# Patient Record
Sex: Female | Born: 1960 | Race: Black or African American | Hispanic: No | Marital: Married | Smoking: Never smoker
Health system: Southern US, Community
[De-identification: ages and names within clinical notes are randomized; demographics above are authoritative.]

## PROBLEM LIST (undated history)

## (undated) ENCOUNTER — Emergency Department (HOSPITAL_COMMUNITY): Payer: Self-pay

## (undated) DIAGNOSIS — T783XXA Angioneurotic edema, initial encounter: Secondary | ICD-10-CM

## (undated) DIAGNOSIS — L509 Urticaria, unspecified: Secondary | ICD-10-CM

## (undated) HISTORY — DX: Urticaria, unspecified: L50.9

## (undated) HISTORY — PX: ORIF ANKLE FRACTURE BIMALLEOLAR: SUR920

## (undated) HISTORY — DX: Angioneurotic edema, initial encounter: T78.3XXA

---

## 1998-08-05 ENCOUNTER — Ambulatory Visit (HOSPITAL_COMMUNITY): Admission: RE | Admit: 1998-08-05 | Discharge: 1998-08-05 | Payer: Self-pay | Admitting: Obstetrics & Gynecology

## 2000-09-17 ENCOUNTER — Other Ambulatory Visit: Admission: RE | Admit: 2000-09-17 | Discharge: 2000-09-17 | Payer: Self-pay | Admitting: Obstetrics & Gynecology

## 2010-08-10 ENCOUNTER — Emergency Department (HOSPITAL_COMMUNITY): Admission: EM | Admit: 2010-08-10 | Discharge: 2010-08-10 | Payer: Self-pay | Admitting: Emergency Medicine

## 2010-08-22 ENCOUNTER — Ambulatory Visit (HOSPITAL_COMMUNITY): Admission: RE | Admit: 2010-08-22 | Discharge: 2010-08-23 | Payer: Self-pay | Admitting: Orthopaedic Surgery

## 2010-12-26 LAB — URINALYSIS, ROUTINE W REFLEX MICROSCOPIC
Bilirubin Urine: NEGATIVE
Glucose, UA: NEGATIVE mg/dL
Glucose, UA: NEGATIVE mg/dL
Hgb urine dipstick: NEGATIVE
Hgb urine dipstick: NEGATIVE
Protein, ur: NEGATIVE mg/dL
Specific Gravity, Urine: 1.026 (ref 1.005–1.030)
Specific Gravity, Urine: 1.026 (ref 1.005–1.030)
Urobilinogen, UA: 4 mg/dL — ABNORMAL HIGH (ref 0.0–1.0)

## 2010-12-26 LAB — URINE CULTURE
Colony Count: NO GROWTH
Culture  Setup Time: 201111082129
Culture: NO GROWTH

## 2010-12-26 LAB — COMPREHENSIVE METABOLIC PANEL
Alkaline Phosphatase: 89 U/L (ref 39–117)
BUN: 10 mg/dL (ref 6–23)
CO2: 28 mEq/L (ref 19–32)
Chloride: 111 mEq/L (ref 96–112)
Creatinine, Ser: 0.84 mg/dL (ref 0.4–1.2)
GFR calc non Af Amer: >60 mL/min (ref >60)
Glucose, Bld: 89 mg/dL (ref 70–99)
Potassium: 4.2 mEq/L (ref 3.5–5.1)
Total Bilirubin: 0.8 mg/dL (ref 0.3–1.2)

## 2010-12-26 LAB — DIFFERENTIAL
Basophils Absolute: 0.1 10*3/uL (ref 0.0–0.1)
Basophils Relative: 1 % (ref 0–1)
Lymphocytes Relative: 23 % (ref 12–46)
Monocytes Absolute: 0.5 10*3/uL (ref 0.1–1.0)
Neutro Abs: 3.5 10*3/uL (ref 1.7–7.7)

## 2010-12-26 LAB — CBC
HCT: 34 % — ABNORMAL LOW (ref 36.0–46.0)
Hemoglobin: 10.8 g/dL — ABNORMAL LOW (ref 12.0–15.0)
MCH: 25 pg — ABNORMAL LOW (ref 26.0–34.0)
MCV: 78.7 fL (ref 78.0–100.0)
RBC: 4.32 MIL/uL (ref 3.87–5.11)
WBC: 5.3 10*3/uL (ref 4.0–10.5)

## 2010-12-26 LAB — SURGICAL PCR SCREEN: Staphylococcus aureus: NEGATIVE

## 2010-12-26 LAB — URINE MICROSCOPIC-ADD ON

## 2010-12-26 LAB — PROTIME-INR: Prothrombin Time: 13.4 seconds (ref 11.6–15.2)

## 2010-12-26 LAB — APTT: aPTT: 27 seconds (ref 24–37)

## 2012-01-03 ENCOUNTER — Emergency Department (HOSPITAL_COMMUNITY)
Admission: EM | Admit: 2012-01-03 | Discharge: 2012-01-03 | Disposition: A | Discharge status: Home / Self Care | Payer: BC Managed Care – PPO | Attending: Emergency Medicine | Admitting: Emergency Medicine

## 2012-01-03 ENCOUNTER — Emergency Department (HOSPITAL_COMMUNITY): Payer: BC Managed Care – PPO

## 2012-01-03 ENCOUNTER — Encounter (HOSPITAL_COMMUNITY): Payer: Self-pay | Admitting: Adult Health

## 2012-01-03 DIAGNOSIS — N898 Other specified noninflammatory disorders of vagina: Secondary | ICD-10-CM | POA: Insufficient documentation

## 2012-01-03 DIAGNOSIS — N939 Abnormal uterine and vaginal bleeding, unspecified: Secondary | ICD-10-CM

## 2012-01-03 LAB — CBC
MCH: 24.9 pg — ABNORMAL LOW (ref 26.0–34.0)
MCHC: 32.1 g/dL (ref 30.0–36.0)
MCV: 77.7 fL — ABNORMAL LOW (ref 78.0–100.0)
Platelets: 294 10*3/uL (ref 150–400)
RBC: 4.49 MIL/uL (ref 3.87–5.11)

## 2012-01-03 LAB — URINE MICROSCOPIC-ADD ON

## 2012-01-03 LAB — COMPREHENSIVE METABOLIC PANEL
ALT: 7 U/L (ref 0–35)
BUN: 8 mg/dL (ref 6–23)
Calcium: 8.9 mg/dL (ref 8.4–10.5)
GFR calc Af Amer: >90 mL/min (ref >90)
Glucose, Bld: 104 mg/dL — ABNORMAL HIGH (ref 70–99)
Sodium: 136 mEq/L (ref 135–145)
Total Protein: 7.8 g/dL (ref 6.0–8.3)

## 2012-01-03 LAB — DIFFERENTIAL
Basophils Relative: 1 % (ref 0–1)
Eosinophils Absolute: 0 10*3/uL (ref 0.0–0.7)
Eosinophils Relative: 0 % (ref 0–5)
Lymphs Abs: 1.1 10*3/uL (ref 0.7–4.0)

## 2012-01-03 LAB — URINALYSIS, ROUTINE W REFLEX MICROSCOPIC
Bilirubin Urine: NEGATIVE
Ketones, ur: NEGATIVE mg/dL
Protein, ur: >300 mg/dL — AB
Specific Gravity, Urine: 1.025 (ref 1.005–1.030)
Urobilinogen, UA: 0.2 mg/dL (ref 0.0–1.0)

## 2012-01-03 MED ORDER — MEDROXYPROGESTERONE ACETATE 5 MG PO TABS: 10.0000 mg | ORAL_TABLET | Freq: Every day | ORAL | Status: DC

## 2012-01-03 NOTE — ED Notes (Signed)
Pt c/o vaginal bleeding that began yesterday and is associated with clots and heavy bleeding staes she is going through one pad an hour. C/o heart palpitations.

## 2012-01-03 NOTE — ED Provider Notes (Signed)
History     CSN: [id number]  Arrival date & time 01/03/12  4098   First MD Initiated Contact with Patient 01/03/12 0444      Chief Complaint  Patient presents with  . Vaginal Bleeding    (Consider location/radiation/quality/duration/timing/severity/associated sxs/prior treatment) HPI Comments: Started with what she believed was her normal menstrual period yesterday but has been bleeding much more than normal.  She felt light-headed at one point and decided to come in to be checked.  She denies pain.  Patient is a 51 y.o. female presenting with vaginal bleeding. The history is provided by the patient.  Vaginal Bleeding This is a new problem. The current episode started yesterday. The problem occurs constantly. The problem has been gradually worsening. Pertinent negatives include no abdominal pain. The symptoms are aggravated by nothing. The symptoms are relieved by nothing. She has tried nothing for the symptoms.    History reviewed. No pertinent past medical history.  History reviewed. No pertinent past surgical history.  History reviewed. No pertinent family history.  History  Substance Use Topics  . Smoking status: Never Smoker   . Smokeless tobacco: Not on file  . Alcohol Use: No    OB History    Grav Para Term Preterm Abortions TAB SAB Ect Mult Living                  Review of Systems  Gastrointestinal: Negative for abdominal pain.  Genitourinary: Positive for vaginal bleeding.  All other systems reviewed and are negative.    Allergies  Codeine  Home Medications  No current outpatient prescriptions on file.  BP 142/92  Pulse 81  Temp(Src) 98.1 F (36.7 C) (Oral)  Resp 18  SpO2 100%  Physical Exam  Nursing note and vitals reviewed. Constitutional: She is oriented to person, place, and time. She appears well-developed and well-nourished. No distress.  HENT:  Head: Normocephalic and atraumatic.  Neck: Normal range of motion. Neck supple.    Cardiovascular: Normal rate and regular rhythm.   No murmur heard. Pulmonary/Chest: Effort normal and breath sounds normal. No respiratory distress.  Abdominal: Soft. She exhibits no distension. There is no tenderness.  Musculoskeletal: Normal range of motion. She exhibits no edema.  Neurological: She is alert and oriented to person, place, and time.  Skin: Skin is warm and dry. She is not diaphoretic.    ED Course  Procedures (including critical care time)  Labs Reviewed  URINALYSIS, ROUTINE W REFLEX MICROSCOPIC - Abnormal; Notable for the following:    Color, Urine RED (*) BIOCHEMICALS MAY BE AFFECTED BY COLOR   APPearance TURBID (*)    Hgb urine dipstick LARGE (*)    Protein, ur >300 (*)    Leukocytes, UA SMALL (*)    All other components within normal limits  CBC - Abnormal; Notable for the following:    Hemoglobin 11.2 (*)    HCT 34.9 (*)    MCV 77.7 (*)    MCH 24.9 (*)    All other components within normal limits  COMPREHENSIVE METABOLIC PANEL - Abnormal; Notable for the following:    Potassium 3.4 (*)    Glucose, Bld 104 (*)    All other components within normal limits  URINE MICROSCOPIC-ADD ON - Abnormal; Notable for the following:    Bacteria, UA FIELD OBSCURED BY RBC'S (*)    All other components within normal limits  DIFFERENTIAL   No results found.   No diagnosis found.    MDM  The hemoglobin  is stable and ultrasound shows adenomyosis.  I will prescribe provera and have patient see gyn in the near future.  To return prn.        Geoffery Lyons, MD 01/03/12 [phone]

## 2012-01-03 NOTE — ED Notes (Signed)
Patient c/o progressive abnormal, heavy menstrual cycle with blood clots present x 2 days accompanied with intermittent lower abdominal cramping, 5/10.

## 2012-01-03 NOTE — Discharge Instructions (Signed)

## 2012-01-03 NOTE — ED Notes (Signed)
Pelvic setup at bedside.

## 2012-06-15 ENCOUNTER — Emergency Department (HOSPITAL_COMMUNITY)
Admission: EM | Admit: 2012-06-15 | Discharge: 2012-06-15 | Disposition: A | Discharge status: Home / Self Care | Payer: No Typology Code available for payment source | Attending: Emergency Medicine | Admitting: Emergency Medicine

## 2012-06-15 ENCOUNTER — Encounter (HOSPITAL_COMMUNITY): Payer: Self-pay | Admitting: Emergency Medicine

## 2012-06-15 DIAGNOSIS — Z043 Encounter for examination and observation following other accident: Secondary | ICD-10-CM | POA: Insufficient documentation

## 2012-06-15 DIAGNOSIS — S46819A Strain of other muscles, fascia and tendons at shoulder and upper arm level, unspecified arm, initial encounter: Secondary | ICD-10-CM

## 2012-06-15 DIAGNOSIS — M542 Cervicalgia: Secondary | ICD-10-CM | POA: Insufficient documentation

## 2012-06-15 MED ORDER — CYCLOBENZAPRINE HCL 10 MG PO TABS: 10.0000 mg | ORAL_TABLET | Freq: Two times a day (BID) | ORAL | Status: AC | PRN

## 2012-06-15 MED ORDER — IBUPROFEN 800 MG PO TABS
800.0000 mg | ORAL_TABLET | Freq: Once | ORAL | Status: AC
  Administered 2012-06-15: 800 mg via ORAL
  Filled 2012-06-15: qty 1
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
## 2012-06-15 NOTE — ED Provider Notes (Signed)
Medical screening examination/treatment/procedure(s) were conducted as a shared visit with non-physician practitioner(s) and myself.  I personally evaluated the patient during the encounter  Cindy Owens T Azucena Dart, MD 06/15/12 2338 
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
## 2013-01-23 ENCOUNTER — Emergency Department (INDEPENDENT_AMBULATORY_CARE_PROVIDER_SITE_OTHER)
Admission: EM | Admit: 2013-01-23 | Discharge: 2013-01-23 | Disposition: A | Discharge status: Home / Self Care | Payer: BC Managed Care – PPO | Source: Home / Self Care | Attending: Emergency Medicine | Admitting: Emergency Medicine

## 2013-01-23 ENCOUNTER — Encounter (HOSPITAL_COMMUNITY): Payer: Self-pay | Admitting: Emergency Medicine

## 2013-01-23 DIAGNOSIS — H6120 Impacted cerumen, unspecified ear: Secondary | ICD-10-CM

## 2013-01-23 DIAGNOSIS — J029 Acute pharyngitis, unspecified: Secondary | ICD-10-CM

## 2013-01-23 DIAGNOSIS — B351 Tinea unguium: Secondary | ICD-10-CM

## 2013-01-23 DIAGNOSIS — H6123 Impacted cerumen, bilateral: Secondary | ICD-10-CM

## 2013-01-23 MED ORDER — FEXOFENADINE HCL 60 MG PO TABS: 60.0000 mg | ORAL_TABLET | Freq: Two times a day (BID) | ORAL | Status: DC

## 2013-01-23 MED ORDER — IBUPROFEN 400 MG PO TABS: 400.0000 mg | ORAL_TABLET | Freq: Four times a day (QID) | ORAL | Status: DC | PRN

## 2013-01-23 NOTE — ED Notes (Signed)
Pt c/o sore throat onset 2 days Reports it started w/bilateral clogged ears Sx now include: dysphagia, nasal congestion Denies: f/v/n/d Taking OTC cold meds w/temp relief  Also c/o poss fungus that has discolored her toe nails on both feet Occasional itching is associated   She is alert and oriented w/no signs of acute distress.

## 2013-01-23 NOTE — ED Provider Notes (Signed)
History     CSN: [id number]  Arrival date & time 01/23/13  1013   First MD Initiated Contact with Patient 01/23/13 1022      Chief Complaint  Patient presents with  . Sore Throat    (Consider location/radiation/quality/duration/timing/severity/associated sxs/prior treatment) HPI Comments: Patient presents urgent care describing a sore throat for 2 days with mild congestion runny nose occasional sneezing. He does hurt when she swallows. Patient also describes that she has had a fungal infection of her toenails on both feet for several months. Is inquiring also to establish with a primary care Dr. and we had any ideas. Patient denies any fevers, neck pain, headache, bodyaches cough or shortness of breath associated with her sore throat. He is taking some over-the-counter medicines with partial relief  Patient is a 52 y.o. female presenting with pharyngitis. The history is provided by the patient.  Sore Throat This is a new problem. The current episode started 2 days ago. The problem occurs constantly. The problem has not changed since onset.Pertinent negatives include no chest pain, no abdominal pain, no headaches and no shortness of breath. The symptoms are aggravated by swallowing. Nothing relieves the symptoms. The treatment provided no relief.    History reviewed. No pertinent past medical history.  Past Surgical History  Procedure Laterality Date  . Orif ankle fracture bimalleolar      No family history on file.  History  Substance Use Topics  . Smoking status: Never Smoker   . Smokeless tobacco: Never Used  . Alcohol Use: No    OB History   Grav Para Term Preterm Abortions TAB SAB Ect Mult Living                  Review of Systems  Constitutional: Negative for fever, chills, activity change, appetite change and fatigue.  HENT: Positive for ear pain, congestion, sore throat and rhinorrhea. Negative for facial swelling, trouble swallowing, neck pain, neck stiffness,  dental problem and ear discharge.   Eyes: Negative for pain.  Respiratory: Negative for shortness of breath.   Cardiovascular: Negative for chest pain.  Gastrointestinal: Negative for abdominal pain.  Endocrine: Negative for polyuria.  Skin: Negative for color change and rash.  Neurological: Negative for weakness and headaches.  Hematological: Negative for adenopathy.    Allergies  Codeine  Home Medications   Current Outpatient Rx  Name  Route  Sig  Dispense  Refill  . Aspirin-Salicylamide-Caffeine (BC HEADACHE POWDER PO)   Oral   Take 1 packet by mouth daily as needed. For pain.         . fexofenadine (ALLEGRA) 60 MG tablet   Oral   Take 1 tablet (60 mg total) by mouth 2 (two) times daily.   15 tablet   0   . ibuprofen (ADVIL,MOTRIN) 400 MG tablet   Oral   Take 1 tablet (400 mg total) by mouth every 6 (six) hours as needed for pain.   15 tablet   0     BP 140/86  Pulse 67  Temp(Src) 98 F (36.7 C) (Oral)  Resp 16  SpO2 100%  LMP 05/20/2012  Physical Exam  Nursing note and vitals reviewed. Constitutional: Vital signs are normal. She appears well-developed and well-nourished.  Non-toxic appearance. She does not have a sickly appearance. She does not appear ill. No distress.  HENT:  Head: Normocephalic.  Right Ear: Tympanic membrane normal.  Left Ear: Tympanic membrane normal.  Ears:  Mouth/Throat: Uvula is midline. Posterior oropharyngeal erythema  present. No oropharyngeal exudate, posterior oropharyngeal edema or tonsillar abscesses.  Eyes: Conjunctivae and EOM are normal. Pupils are equal, round, and reactive to light. No scleral icterus.  Neck: Neck supple.  Pulmonary/Chest: Effort normal and breath sounds normal. No respiratory distress. She has no decreased breath sounds.  Lymphadenopathy:    She has no cervical adenopathy.  Neurological: She is alert.  Skin: No rash noted. No erythema.    ED Course  Procedures (including critical care  time)  Labs Reviewed  POCT RAPID STREP A ([facility] ONLY)   No results found.   1. Pharyngitis   2. Cerumen impaction, bilateral   3. Onychomycosis       MDM  #1 pharyngitis. Most likely allergenic as patient also has rhinorrhea nasal congestion and afebrile. Have been prescribed ibuprofen and a course of Allegra.  Problem #2 cerumen impaction - effective cerumen evacuation through irrigation  Problem #3 chronic onychomycosis, patient has been explained that we will not initiate treatment that she needs to establish a primary care Dr. for long-term treatment and monitor of liver functional test she is agreeable and understands rationale. Several referrals are being provided to her to establish a primary care Dr. for these reasons and also healthcare maintenance.      Jimmie Molly, MD 01/23/13 1227

## 2013-07-08 IMAGING — US US TRANSVAGINAL NON-OB
1 series · 13 of 25 positions shown · non-contrast
Comparison: None.

CLINICAL DATA: Vaginal bleeding.

TRANSABDOMINAL AND TRANSVAGINAL ULTRASOUND OF PELVIS
TECHNIQUE: Both transabdominal and transvaginal ultrasound
examinations of the pelvis were performed. Transabdominal technique
was performed for global imaging of the pelvis including uterus,
ovaries, adnexal regions, and pelvic cul-de-sac.

[Series 1: us transvaginal non-ob · 0.30mm/px · 13 of 36 slices shown]
[im 1/36]
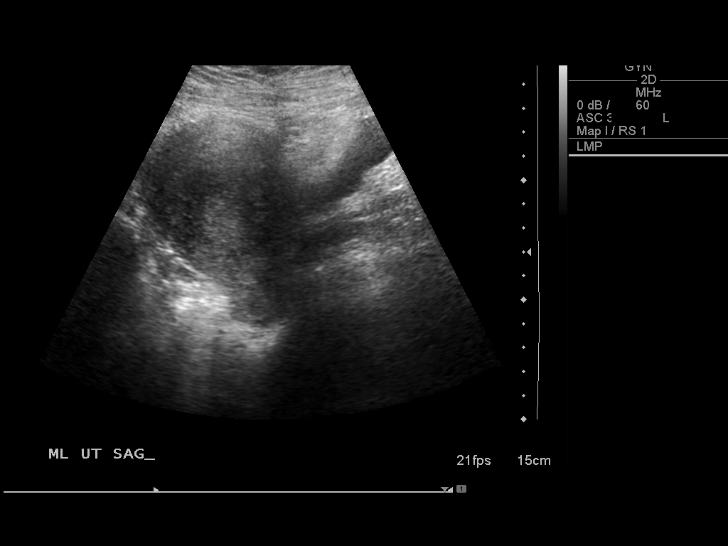
[im 3/36]
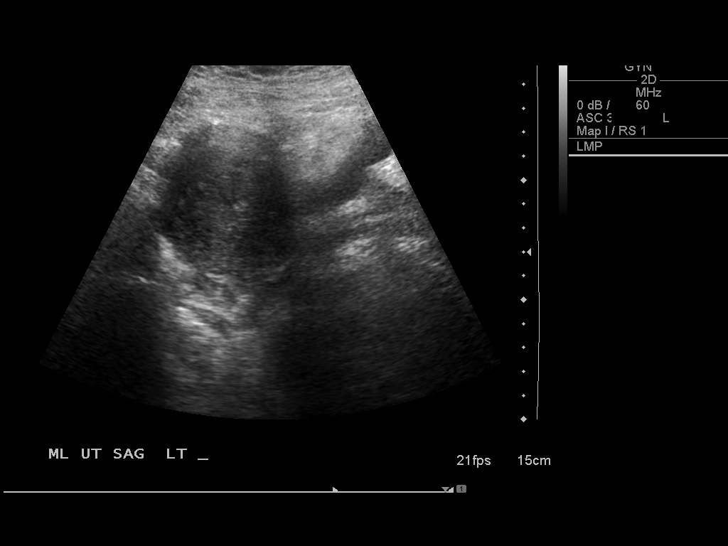
[im 6/36]
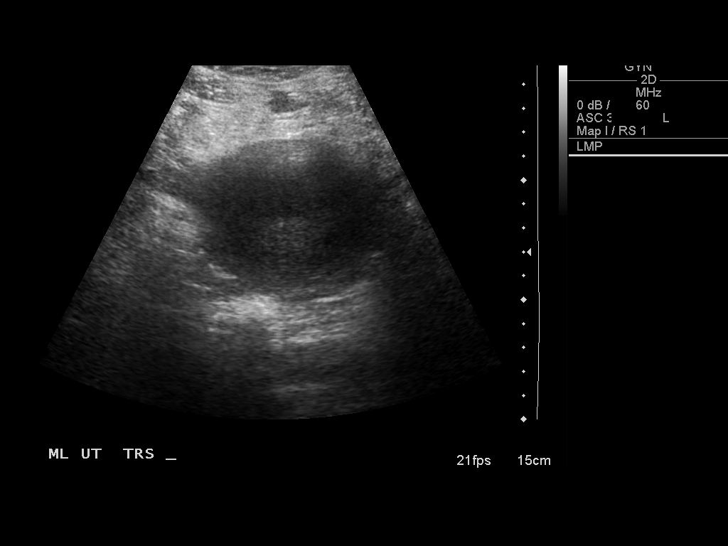
[im 9/36]
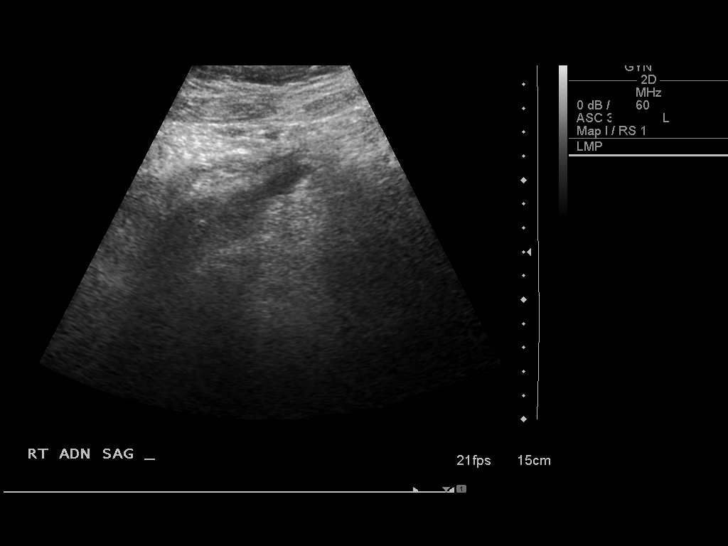
[im 12/36]
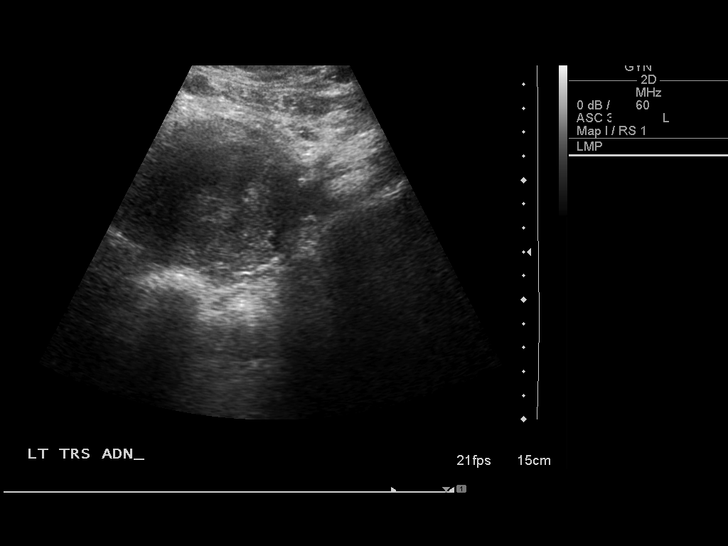
[im 15/36]
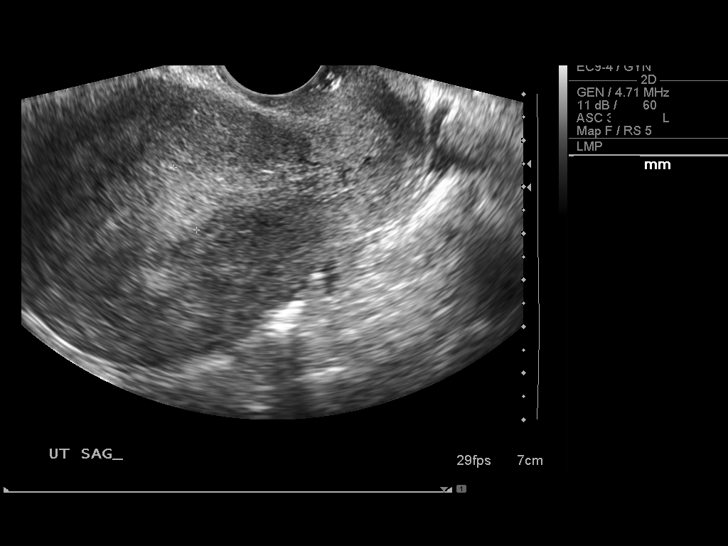
[im 18/36]
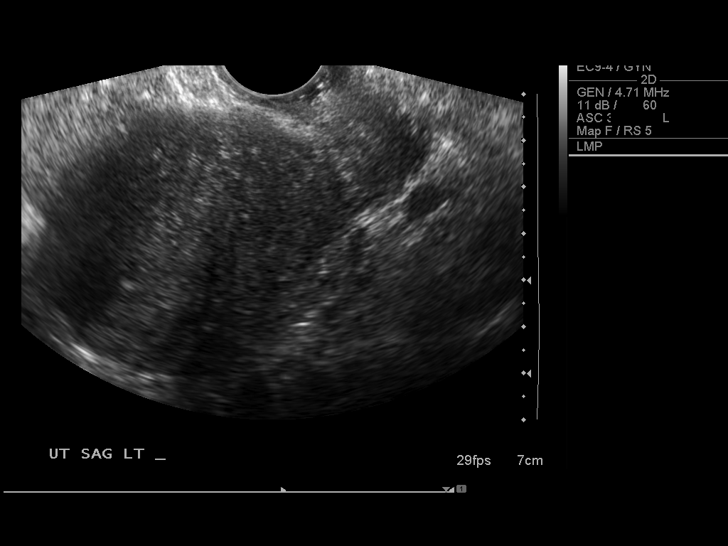
[im 21/36]
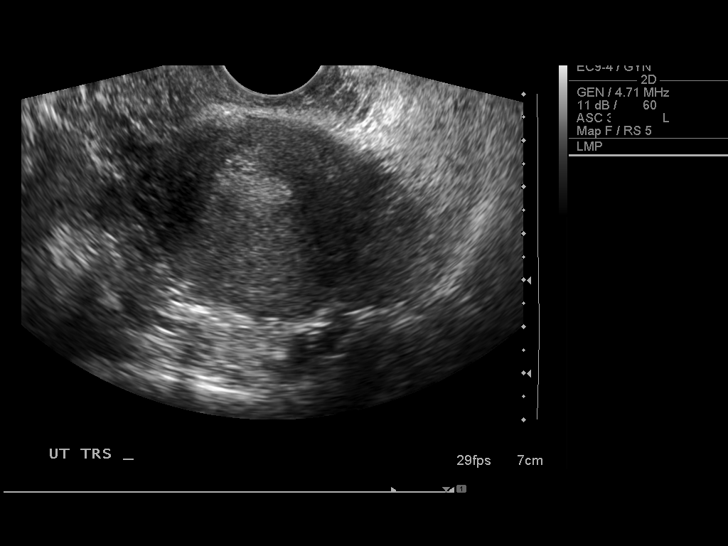
[im 24/36]
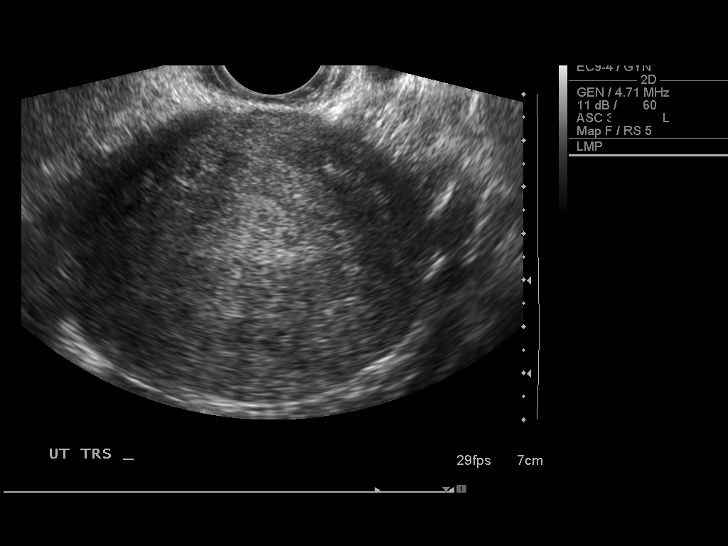
[im 27/36]
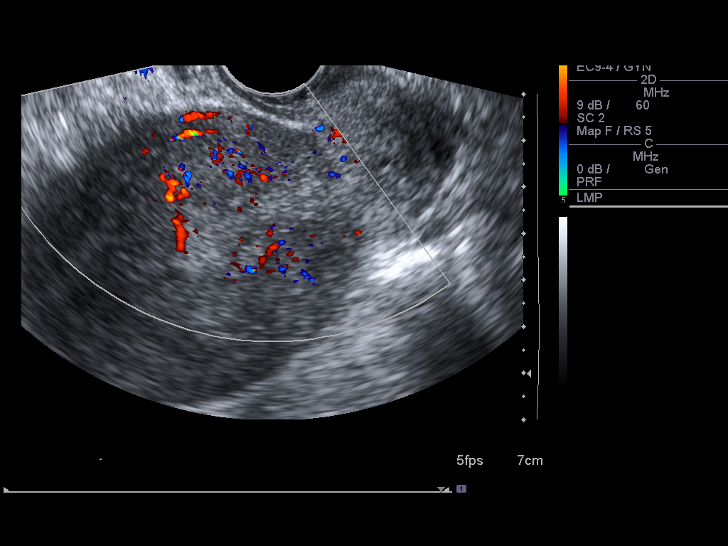
[im 30/36]
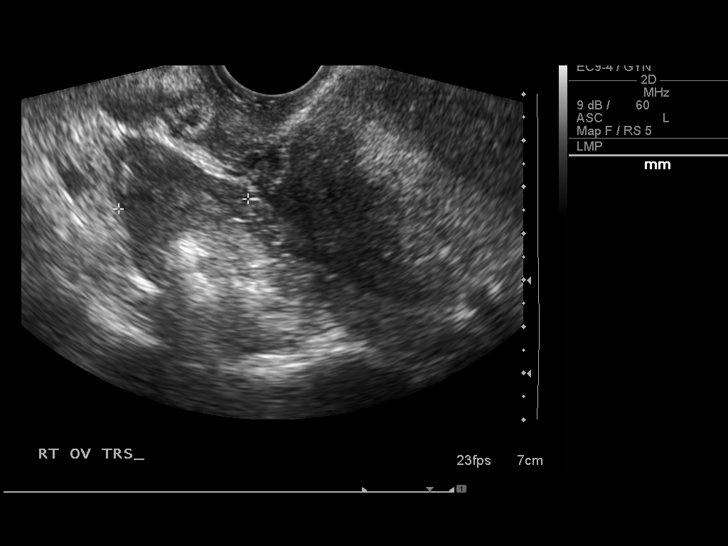
[im 33/36]
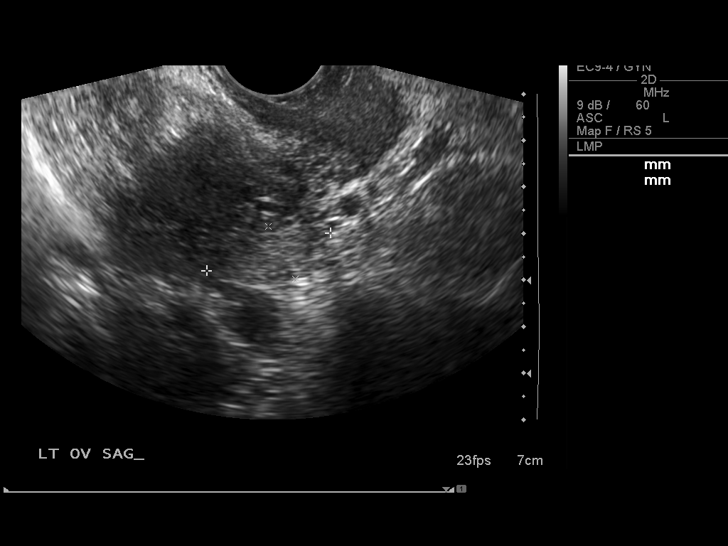
[im 36/36]
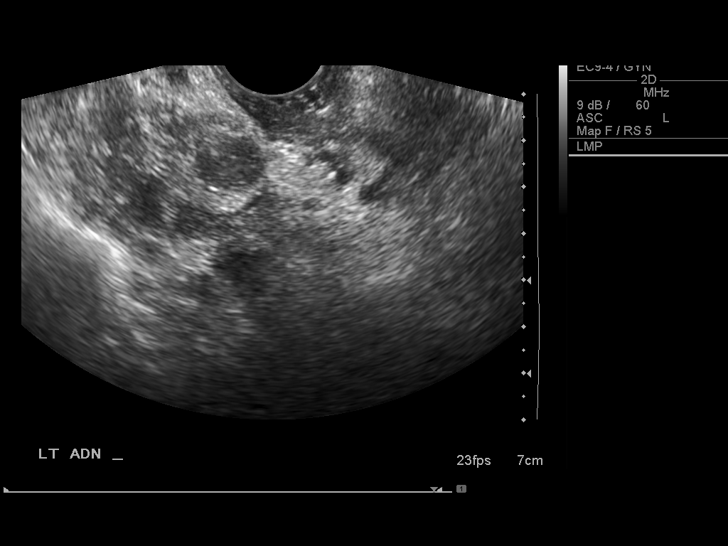

[13 of 25 positions shown; findings below may reference images not displayed]

It was necessary to proceed with endovaginal exam following the
transabdominal exam to visualize the uterus and ovaries in greater
detail.
FINDINGS: Uterus: Mildly prominent; measures 9.9 x 6.3 x 7.7 cm.  Scattered
foci of increased echogenicity are seen within the myometrium, with
diffuse heterogeneity of the myometrium and endometrial echo
complex, raising suspicion for diffuse adenomyosis.

Endometrium: The endometrial echo complex is prominent and poorly
defined, measuring 1.4 cm in thickness.

Right ovary:  Normal appearance/no adnexal mass; measures 2.9 x
x 2.8 cm.

Left ovary: Normal appearance/no adnexal mass; measures 2.8 x 1.3 x
1.1 cm.

There is no evidence for ovarian torsion.

Other findings: A trace amount of free fluid is seen within the
pelvic cul-de-sac.
IMPRESSION: 1.  Diffuse heterogeneity of the myometrium and at the endometrial
echo complex, with scattered foci of increased echogenicity in the
myometrium.  This is suspicious for diffuse adenomyosis.
2.  Prominent poorly defined endometrial echo complex, measuring
1.4 cm in thickness.  If the patient's vaginal bleeding persists,
further evaluation is recommended to exclude an underlying mass.

## 2014-08-04 ENCOUNTER — Encounter: Payer: Self-pay | Admitting: Podiatry

## 2014-08-04 ENCOUNTER — Ambulatory Visit (INDEPENDENT_AMBULATORY_CARE_PROVIDER_SITE_OTHER): Payer: BC Managed Care – PPO | Admitting: Podiatry

## 2014-08-04 VITALS — BP 141/69 | HR 69 | Resp 13 | Ht 64.0 in | Wt 205.0 lb

## 2014-08-04 DIAGNOSIS — L84 Corns and callosities: Secondary | ICD-10-CM

## 2014-08-04 DIAGNOSIS — B351 Tinea unguium: Secondary | ICD-10-CM

## 2014-08-04 DIAGNOSIS — M79676 Pain in unspecified toe(s): Secondary | ICD-10-CM

## 2014-08-04 MED ORDER — CEPHALEXIN 500 MG PO CAPS: 500.0000 mg | ORAL_CAPSULE | Freq: Three times a day (TID) | ORAL | Status: AC

## 2014-08-04 NOTE — Progress Notes (Signed)
   Subjective:    Patient ID: Cindy Owens, female    DOB: [birth year], 53 y.o.   MRN: [id number]  HPI Comments: Cindy Owens, 53 year old female, presents to the office today with complaints of nail fungus to her bilateral first and fifth toes. She also states that she has a painful callus on the plantar aspect of her left foot for which she points to the fifth MTPJ plantarly. She states of this area is very tender particularly with weightbearing. At this time the patient is requesting to have bilateral big toenails permanently removed due to the chronic thickness and pain particularly with shoe gear. She is attended multiple over-the-counter therapies without any resolution of symptoms. She does not wish to be on any oral medications. No other complaints at this time.     Review of Systems  All other systems reviewed and are negative.      Objective:   Physical Exam AAO x3, NAD DP/PT pulses palpable bilaterally, CRT less than 3 seconds Protective sensation intact with Simms Weinstein monofilament, vibratory sensation intact, Achilles tendon reflex intact Bilateral hallux nails hypertrophic, dystrophic, elongated, brittle without any surrounding erythema or drainage. The nail is loosely adhered to the underlying nail bed and firmly. Along the very proximal border. There is tenderness directly over the nail. Fifth digit nails hypertrophic, dystrophic, brittle without any surrounding erythema or drainage. Left submetatarsal 5 thick hyperkeratotic tissue without any sign of erythema, edema, increased warmth. Upon debridement underlying skin is intact without any ulceration. MMT 5/5, ROM WNL No calf pain with compression, swelling, warmth        Assessment & Plan:  53 year old female with bilateral hallux/fifth digit onychomycosis, left submetatarsal 5 hyperkeratotic lesion. -Conservative versus surgical treatment discussed including alternatives, risks, complications. -For bilateral nail  fungus the patient is requesting permanent nail avulsions of bilateral hallux nails. I discussed with her various treatment options including both topical versus oral medication, laser, nail biopsy, nail removal. At this time the patient request permanent nail removal due to the pain the nails cause and the thickness of them. Risks and complications discussed with the patient in detail for which she understood and agrees to procedure. Under standard conditions a total of 2.5 mL of a one-to-one mixture of 2% lidocaine plain and 0.5% Marcaine plain was infiltrated a hallux block fashion bilaterally. Once anesthetized bilateral hallux were prepped in sterile fashion. Tourniquets were then applied. Bilateral hallux nails were then completely removed making sure to remove all nail borders. The nails were very thick and there was ingrowing on both the medial and lateral nail borders. There was no purulence or other clinical signs of infection. Once the nails were shortened to be removed in the area was debrided of subungual debris phenol was applied under standard conditions and the area was then copiously irrigated. Silvadene was then applied followed by a nonadherent dressing and a dry sterile dressing. After application of the dressings the tourniquets were removed and there is noted to be an immediate capillary refill time to the digits. Patient tolerated the procedures well without complications. -Post procedure instructions discussed with the patient in detail for which she verbally understood. -Prescribed Keflex. -Left submetatarsal 5 lesion shortly debrided without complications. -Follow-up in one week. In the meantime call the office with any questions, concerns, change in symptoms. Not any clinical signs or symptoms of infection and instructed to call the office immediately if any are to occur or go directly to the emergency room.

## 2014-08-04 NOTE — Patient Instructions (Addendum)
Betadine Soak Instructions  Purchase an 8 oz. bottle of BETADINE solution (Povidone)  THE DAY AFTER THE PROCEDURE  Place 1 tablespoon of betadine solution in a quart of warm tap water.  Submerge your foot or feet with outer bandage intact for the initial soak; this will allow the bandage to become moist and wet for easy lift off.  Once you remove your bandage, continue to soak in the solution for 20 minutes.  This soak should be done twice a day.  Next, remove your foot or feet from solution, blot dry the affected area and cover.  You may use a band aid large enough to cover the area or use gauze and tape.  Apply other medications to the area as directed by the doctor such as cortisporin otic solution (ear drops) or neosporin.  IF YOUR SKIN BECOMES IRRITATED WHILE USING THESE INSTRUCTIONS, IT IS OKAY TO SWITCH TO EPSOM SALTS AND WATER OR WHITE VINEGAR AND WATER.  Monitor for any signs/symptoms of infection. Call the office immediately if any occur or go directly to the emergency room. Call with any questions/concerns.  

## 2014-08-09 ENCOUNTER — Ambulatory Visit (INDEPENDENT_AMBULATORY_CARE_PROVIDER_SITE_OTHER): Payer: BC Managed Care – PPO | Admitting: Podiatry

## 2014-08-09 ENCOUNTER — Encounter: Payer: Self-pay | Admitting: Podiatry

## 2014-08-09 VITALS — BP 153/81 | HR 66 | Temp 98.1°F | Resp 14

## 2014-08-09 DIAGNOSIS — B351 Tinea unguium: Secondary | ICD-10-CM

## 2014-08-09 NOTE — Patient Instructions (Signed)
Soak right and left great toes daily in Epsom salts apply antibiotic ointment and light gauze dressing until a scab forms

## 2014-08-10 NOTE — Progress Notes (Signed)
Patient ID: Cindy Owens, female   DOB: 05/26/1961, 53 y.o.   MRN: [id number]  Subjective: This patient presents postop hallux phenol matricectomy's bilaterally performed by Dr. Loreta AveWagner on 08/04/2014. Patient is complaining of some bleeding in the left surgical site.  Objective: The right and left hallux nail beds are moist, macerated with low-grade edema and no active drainage. There is no malodor or warmth noted in the surgical sites.  Assessment: No clinical signs of infection in the hallux nail beds bilaterally Macerated nail beds from excessive occlusion in the hallux nail beds bilaterally.  Plan: Patient advised to soak toes daily and salt and apply a minimal amount of topical antibiotic ointment and a small gauze pad.  Review of point 4 weeks to follow by Dr. Ardelle AntonWagoner sooner if patient has a concern

## 2014-08-18 ENCOUNTER — Ambulatory Visit: Payer: BC Managed Care – PPO | Admitting: Podiatry

## 2014-09-08 ENCOUNTER — Ambulatory Visit (INDEPENDENT_AMBULATORY_CARE_PROVIDER_SITE_OTHER): Payer: BC Managed Care – PPO | Admitting: Podiatry

## 2014-09-08 ENCOUNTER — Encounter: Payer: Self-pay | Admitting: Podiatry

## 2014-09-08 VITALS — BP 147/101 | HR 71 | Resp 18

## 2014-09-08 DIAGNOSIS — Z9889 Other specified postprocedural states: Secondary | ICD-10-CM

## 2014-09-08 DIAGNOSIS — B351 Tinea unguium: Secondary | ICD-10-CM

## 2014-09-08 NOTE — Patient Instructions (Signed)
Continue soaking in epsom salt soaks twice a day followed by antibiotic ointment and a band-aid. Can leave uncovered at night. Monitor for any signs/symptoms of infection. Call the office immediately if any occur or go directly to the emergency room. Call with any questions/concerns.

## 2014-09-13 NOTE — Progress Notes (Addendum)
Patient ID: Cindy NoonLucy L Owens, female   DOB: 08-16-1961, 53 y.o.   MRN: [id number]  Subjective: 53 year old female returns the office they for follow-up evaluation status post bilateral hallux nail avulsions secondary to thick, painful toenails. She states that she is having some discomfort intermittently to the right procedure site however the left is doing well without any complaints. She states that she continues to soak the feet twice a day in Epson salts cover with antibiotic ointment and a Band-Aid. She denies any drainage from the area. No purulence identified. No surrounding erythema or red streaks. Denies any systemic complaints as fevers, chills, nausea, vomiting. No other complaints at this time. The patient has been able to work and perform daily activities without any complications.  Objective: AAO 3, NAD DP/PT pulses palpable bilaterally, CRT less than 3 seconds Protective sensation intact with Simms Weinstein monofilament, vibratory sensation intact Status post bilateral hallux total nail avulsions. A small amount of granulation tissue within the central aspect of the and nail bed on the left. The right side has healed. There is no surrounding erythema, edema, ascending saline disc. No drainage or purulence identified. No areas of fluctuance, crepitus, malodor. No signs of infection. No other lesions identified. No pain with calf compression, swelling, warmth, erythema.  Assessment: 53 year old female status post bilateral hallux total nail avulsion secondary to thick, painful hallux nails.  Plan: -Treatment options were discussed including alternatives, risks, complications. -Continue soaking the feet twice a day Epson salts until the area has completely healed on the left. Continue to cover during the day while working however can leave uncovered at night. Discussed her not to apply a large amount of antibiotic ointment to this site as it appears that there was copious amounts of  ointment on the toes at today's visit.  -Monitor for any clinical signs or symptoms of infection. Discussed the patient if any are to occur to call the office or go directly to the emergency room. -Follow-up in 2 weeks or sooner if any problems are to arise. In the meantime call the office any questions, concerns, changes symptoms. Follow up with PCP for Blood pressure- currently without any symptoms.

## 2020-08-15 HISTORY — PX: KNEE CARTILAGE SURGERY: SHX688

## 2020-12-12 ENCOUNTER — Encounter (HOSPITAL_BASED_OUTPATIENT_CLINIC_OR_DEPARTMENT_OTHER): Payer: Self-pay

## 2020-12-12 ENCOUNTER — Emergency Department (HOSPITAL_BASED_OUTPATIENT_CLINIC_OR_DEPARTMENT_OTHER)
Admission: EM | Admit: 2020-12-12 | Discharge: 2020-12-12 | Disposition: A | Discharge status: Home / Self Care | Payer: Self-pay | Attending: Emergency Medicine | Admitting: Emergency Medicine

## 2020-12-12 ENCOUNTER — Other Ambulatory Visit: Payer: Self-pay

## 2020-12-12 DIAGNOSIS — R21 Rash and other nonspecific skin eruption: Secondary | ICD-10-CM | POA: Insufficient documentation

## 2020-12-12 MED ORDER — DIPHENHYDRAMINE HCL 25 MG PO CAPS
25.0000 mg | ORAL_CAPSULE | Freq: Once | ORAL | Status: AC
  Administered 2020-12-12: 25 mg via ORAL
  Filled 2020-12-12: qty 1

## 2020-12-12 MED ORDER — METHYLPREDNISOLONE 4 MG PO TBPK: ORAL_TABLET | ORAL | 0 refills | Status: DC

## 2020-12-12 NOTE — ED Triage Notes (Signed)
Patient presents POV with Skin Irritation.  Patient began having Skin Irritation with itching since yesterday evening. Patient used "Cortisone" at home. Irritation is located on Upper Left Arm, Left Thigh, And R. Forearm mainly.   No recent contact with known allergens.  A&Ox4, GCS 15. Ambulatory.

## 2020-12-12 NOTE — ED Notes (Signed)
EDP at Bedside 

## 2020-12-12 NOTE — ED Provider Notes (Signed)
[facility] EMERGENCY DEPARTMENT Provider Note   CSN: [id number] Arrival date & time: 12/12/20  4010     History No chief complaint on file.   Cindy Owens is a 60 y.o. female.  The history is provided by the patient.  Rash Location: left arm, leg. Quality: itchiness and redness   Severity:  Mild Onset quality:  Gradual Timing:  Constant Progression:  Spreading Chronicity:  New Context: not insect bite/sting and not new detergent/soap   Context comment:  Worked on a mat at work that she thinks might caused some irritation Relieved by:  Anti-itch cream Worsened by:  Nothing Associated symptoms: no abdominal pain, no diarrhea, no fatigue, no fever, no headaches, no hoarse voice, no induration, no joint pain, no myalgias, no nausea, no periorbital edema, no shortness of breath, no sore throat, no throat swelling, no tongue swelling, no URI, not vomiting and not wheezing        History reviewed. No pertinent past medical history.  There are no problems to display for this patient.   Past Surgical History:  Procedure Laterality Date  . ORIF ANKLE FRACTURE BIMALLEOLAR       OB History   No obstetric history on file.     No family history on file.  Social History   Tobacco Use  . Smoking status: Never Smoker  . Smokeless tobacco: Never Used  Substance Use Topics  . Alcohol use: No  . Drug use: No    Home Medications Prior to Admission medications   Medication Sig Start Date End Date Taking? Authorizing Provider  methylPREDNISolone (MEDROL DOSEPAK) 4 MG TBPK tablet Follow package insert 12/12/20  Yes Curatolo, Adam, DO  Aspirin-Salicylamide-Caffeine (BC HEADACHE POWDER PO) Take 1 packet by mouth daily as needed. For pain.    [provider]  cephALEXin (KEFLEX) 500 MG capsule Take 1 capsule (500 mg total) by mouth 3 (three) times daily. 08/04/14   Vivi Barrack, DPM  cyclobenzaprine (FLEXERIL) 10 MG tablet Take 10 mg by mouth.     [provider]  fexofenadine (ALLEGRA) 60 MG tablet Take 1 tablet (60 mg total) by mouth 2 (two) times daily. 01/23/13   Jimmie Molly, MD  ibuprofen (ADVIL,MOTRIN) 400 MG tablet Take 1 tablet (400 mg total) by mouth every 6 (six) hours as needed for pain. 01/23/13   Jimmie Molly, MD  ibuprofen (ADVIL,MOTRIN) 400 MG tablet Take 400 mg by mouth.    [provider]    Allergies    Codeine and Hydrocodone  Review of Systems   Review of Systems  Constitutional: Negative for fatigue and fever.  HENT: Negative for congestion, facial swelling, hoarse voice, sinus pressure, sinus pain, sore throat, trouble swallowing and voice change.   Respiratory: Negative for shortness of breath and wheezing.   Gastrointestinal: Negative for abdominal pain, diarrhea, nausea and vomiting.  Musculoskeletal: Negative for arthralgias and myalgias.  Skin: Positive for rash.  Neurological: Negative for headaches.    Physical Exam Updated Vital Signs  ED Triage Vitals  Enc Vitals Group     BP 12/12/20 0640 (!) 157/102     Pulse Rate 12/12/20 0640 61     Resp 12/12/20 0640 16     Temp 12/12/20 0640 97.7 F (36.5 C)     Temp Source 12/12/20 0640 Oral     SpO2 12/12/20 0640 100 %     Weight 12/12/20 0641 240 lb (108.9 kg)     Height 12/12/20 0641 5\' 6"  (1.676  m)     Head Circumference --      Peak Flow --      Pain Score 12/12/20 0641 0     Pain Loc --      Pain Edu? --      Excl. in GC? --     Physical Exam Constitutional:      General: She is not in acute distress.    Appearance: She is not ill-appearing.  HENT:     Head:     Comments: No swelling of mouth or tongue or lips, no periorbital edema     Nose: Nose normal.     Mouth/Throat:     Mouth: Mucous membranes are moist.     Pharynx: No oropharyngeal exudate.  Eyes:     Pupils: Pupils are equal, round, and reactive to light.  Cardiovascular:     Pulses: Normal pulses.  Pulmonary:     Effort: Pulmonary effort is normal.      Breath sounds: No stridor. No wheezing.  Musculoskeletal:     Cervical back: Normal range of motion. No tenderness.  Skin:    Capillary Refill: Capillary refill takes less than 2 seconds.     Findings: Rash (hives on left arm and left leg) present.  Neurological:     Mental Status: She is alert.     ED Results / Procedures / Treatments   Labs (all labs ordered are listed, but only abnormal results are displayed) Labs Reviewed - No data to display  EKG None  Radiology No results found.  Procedures Procedures   Medications Ordered in ED Medications  diphenhydrAMINE (BENADRYL) capsule 25 mg (has no administration in time range)    ED Course  I have reviewed the triage vital signs and the nursing notes.  Pertinent labs & imaging results that were available during my care of the patient were reviewed by me and considered in my medical decision making (see chart for details).    MDM Rules/Calculators/A&P                          NASHA DISS is here for rash.  Suspect contact dermatitis.  Has hives to left arm and left leg.  No signs of anaphylaxis on exam.  No swelling of the face or mouth or tongue.  Has tried some cortisone cream at home with minimal relief.  No new medications.  States that she was lying on a mat for work I suspect that may be the cause.  Given a dose of Benadryl and will prescribe a Medrol Dosepak.  Discharged from ED in good condition.  Recommend follow-up with primary care doctor.  This chart was dictated using voice recognition software.  Despite best efforts to proofread,  errors can occur which can change the documentation meaning.   Final Clinical Impression(s) / ED Diagnoses Final diagnoses:  Rash    Rx / DC Orders ED Discharge Orders         Ordered    methylPREDNISolone (MEDROL DOSEPAK) 4 MG TBPK tablet        12/12/20 0647           Virgina Norfolk, DO 12/12/20 [phone]

## 2022-06-03 ENCOUNTER — Emergency Department (HOSPITAL_COMMUNITY)
Admission: EM | Admit: 2022-06-03 | Discharge: 2022-06-03 | Disposition: A | Discharge status: Home / Self Care | Payer: BC Managed Care – PPO | Attending: Emergency Medicine | Admitting: Emergency Medicine

## 2022-06-03 ENCOUNTER — Encounter (HOSPITAL_COMMUNITY): Payer: Self-pay | Admitting: Emergency Medicine

## 2022-06-03 ENCOUNTER — Other Ambulatory Visit: Payer: Self-pay

## 2022-06-03 DIAGNOSIS — R21 Rash and other nonspecific skin eruption: Secondary | ICD-10-CM | POA: Diagnosis present

## 2022-06-03 DIAGNOSIS — L259 Unspecified contact dermatitis, unspecified cause: Secondary | ICD-10-CM | POA: Insufficient documentation

## 2022-06-03 DIAGNOSIS — L299 Pruritus, unspecified: Secondary | ICD-10-CM

## 2022-06-03 DIAGNOSIS — Z7982 Long term (current) use of aspirin: Secondary | ICD-10-CM | POA: Diagnosis not present

## 2022-06-03 LAB — CBC WITH DIFFERENTIAL/PLATELET
Abs Immature Granulocytes: 0.01 10*3/uL (ref 0.00–0.07)
Basophils Absolute: 0 10*3/uL (ref 0.0–0.1)
Basophils Relative: 0 %
Eosinophils Absolute: 0 10*3/uL (ref 0.0–0.5)
Eosinophils Relative: 0 %
HCT: 35.1 % — ABNORMAL LOW (ref 36.0–46.0)
Hemoglobin: 11 g/dL — ABNORMAL LOW (ref 12.0–15.0)
Immature Granulocytes: 0 %
Lymphocytes Relative: 30 %
Lymphs Abs: 1.4 10*3/uL (ref 0.7–4.0)
MCH: 25 pg — ABNORMAL LOW (ref 26.0–34.0)
MCHC: 31.3 g/dL (ref 30.0–36.0)
MCV: 79.8 fL — ABNORMAL LOW (ref 80.0–100.0)
Monocytes Absolute: 0.4 10*3/uL (ref 0.1–1.0)
Monocytes Relative: 8 %
Neutro Abs: 2.7 10*3/uL (ref 1.7–7.7)
Neutrophils Relative %: 62 %
Platelets: 257 10*3/uL (ref 150–400)
RBC: 4.4 MIL/uL (ref 3.87–5.11)
RDW: 15.6 % — ABNORMAL HIGH (ref 11.5–15.5)
WBC: 4.5 10*3/uL (ref 4.0–10.5)
nRBC: 0 % (ref 0.0–0.2)

## 2022-06-03 LAB — HEPATIC FUNCTION PANEL
ALT: 11 U/L (ref 0–44)
AST: 20 U/L (ref 15–41)
Albumin: 3.4 g/dL — ABNORMAL LOW (ref 3.5–5.0)
Alkaline Phosphatase: 93 U/L (ref 38–126)
Bilirubin, Direct: 0.2 mg/dL (ref 0.0–0.2)
Indirect Bilirubin: 0.4 mg/dL (ref 0.3–0.9)
Total Bilirubin: 0.6 mg/dL (ref 0.3–1.2)
Total Protein: 6.6 g/dL (ref 6.5–8.1)

## 2022-06-03 LAB — BASIC METABOLIC PANEL
Anion gap: 8 (ref 5–15)
BUN: 13 mg/dL (ref 6–20)
CO2: 22 mmol/L (ref 22–32)
Calcium: 8.6 mg/dL — ABNORMAL LOW (ref 8.9–10.3)
Chloride: 106 mmol/L (ref 98–111)
Creatinine, Ser: 0.71 mg/dL (ref 0.44–1.00)
GFR, Estimated: >60 mL/min (ref >60)
Glucose, Bld: 98 mg/dL (ref 70–99)
Potassium: 3.7 mmol/L (ref 3.5–5.1)
Sodium: 136 mmol/L (ref 135–145)

## 2022-06-03 MED ORDER — PREDNISONE 10 MG (21) PO TBPK: ORAL_TABLET | Freq: Every day | ORAL | 0 refills | Status: DC

## 2022-06-03 NOTE — Discharge Instructions (Signed)
A prescription for a steroid medication was sent to your pharmacy.  Take this as prescribed.  Continue to take Benadryl as needed.  Maintain good hygiene and keep the skin clean and dry as much as possible.  Return to the emergency department if you experience any worsening of symptoms.

## 2022-06-03 NOTE — ED Provider Notes (Signed)
[facility] EMERGENCY DEPARTMENT Provider Note   CSN: [id number] Arrival date & time: 06/03/22  0146     History  Chief Complaint  Patient presents with   Skin Rashes    Cindy Owens is a 61 y.o. female.  HPI Patient presents for 4 days of pruritic rash.  Medical history includes prior episodes of pruritic rash, treated with Claritin and prednisone.  These episodes have been recurrent over the past 9 months.  Most recently, she was treated in June.  She is unaware of any known allergens.  She does not take any daily medications.  She has been out in the sun but has not been around plants.  4 days ago, she noticed a small area of blister on her anterior right thigh.  This blister resolved and now consist of a dark spot.  She has had progressive generalized pruritic rash over the past 4 days.  She has been treating this at home with Benadryl.  She presents to the ED for persistent symptoms.  2 days ago, patient noticed some upper lip swelling.  This has since resolved.  Patient denies any other recent symptoms.    Home Medications Prior to Admission medications   Medication Sig Start Date End Date Taking? Authorizing Provider  predniSONE (STERAPRED UNI-PAK 21 TAB) 10 MG (21) TBPK tablet Take by mouth daily. Take 6 tabs by mouth daily  for 2 days, then 5 tabs for 2 days, then 4 tabs for 2 days, then 3 tabs for 2 days, 2 tabs for 2 days, then 1 tab by mouth daily for 2 days 06/03/22  Yes Gloris Manchester, MD  Aspirin-Salicylamide-Caffeine [facility] HEADACHE POWDER PO) Take 1 packet by mouth daily as needed. For pain.    [provider]  cephALEXin (KEFLEX) 500 MG capsule Take 1 capsule (500 mg total) by mouth 3 (three) times daily. 08/04/14   Vivi Barrack, DPM  cyclobenzaprine (FLEXERIL) 10 MG tablet Take 10 mg by mouth.    [provider]  fexofenadine (ALLEGRA) 60 MG tablet Take 1 tablet (60 mg total) by mouth 2 (two) times daily. 01/23/13   Jimmie Molly, MD   ibuprofen (ADVIL,MOTRIN) 400 MG tablet Take 1 tablet (400 mg total) by mouth every 6 (six) hours as needed for pain. 01/23/13   Jimmie Molly, MD  ibuprofen (ADVIL,MOTRIN) 400 MG tablet Take 400 mg by mouth.    [provider]      Allergies    Codeine and Hydrocodone    Review of Systems   Review of Systems  Skin:  Positive for rash.  All other systems reviewed and are negative.   Physical Exam Updated Vital Signs BP (!) 147/86 (BP Location: Left Arm)   Pulse 61   Temp 98 F (36.7 C) (Oral)   Resp 16   LMP 05/20/2012   SpO2 98%  Physical Exam Vitals and nursing note reviewed.  Constitutional:      General: She is not in acute distress.    Appearance: Normal appearance. She is well-developed. She is not ill-appearing, toxic-appearing or diaphoretic.  HENT:     Head: Normocephalic and atraumatic.     Right Ear: External ear normal.     Left Ear: External ear normal.     Nose: Nose normal.     Mouth/Throat:     Mouth: Mucous membranes are moist.     Pharynx: Oropharynx is clear.  Eyes:     Extraocular Movements: Extraocular movements intact.  Conjunctiva/sclera: Conjunctivae normal.  Cardiovascular:     Rate and Rhythm: Normal rate and regular rhythm.     Heart sounds: No murmur heard. Pulmonary:     Effort: Pulmonary effort is normal. No respiratory distress.     Breath sounds: Normal breath sounds. No wheezing or rales.  Abdominal:     General: There is no distension.     Palpations: Abdomen is soft.     Tenderness: There is no abdominal tenderness.  Musculoskeletal:        General: No swelling. Normal range of motion.     Cervical back: Normal range of motion and neck supple.     Right lower leg: No edema.     Left lower leg: No edema.  Skin:    General: Skin is warm and dry.     Capillary Refill: Capillary refill takes less than 2 seconds.     Findings: Rash present.     Comments: 1 cm dark spot on anterior right thigh.  Generalized, nonraised,  areas of faint erythema and pruritus.  Neurological:     General: No focal deficit present.     Mental Status: She is alert and oriented to person, place, and time.     Cranial Nerves: No cranial nerve deficit.     Sensory: No sensory deficit.     Motor: No weakness.     Coordination: Coordination normal.  Psychiatric:        Mood and Affect: Mood normal.        Behavior: Behavior normal.        Thought Content: Thought content normal.        Judgment: Judgment normal.     ED Results / Procedures / Treatments   Labs (all labs ordered are listed, but only abnormal results are displayed) Labs Reviewed  CBC WITH DIFFERENTIAL/PLATELET - Abnormal; Notable for the following components:      Result Value   Hemoglobin 11.0 (*)    HCT 35.1 (*)    MCV 79.8 (*)    MCH 25.0 (*)    RDW 15.6 (*)    All other components within normal limits  BASIC METABOLIC PANEL - Abnormal; Notable for the following components:   Calcium 8.6 (*)    All other components within normal limits  HEPATIC FUNCTION PANEL - Abnormal; Notable for the following components:   Albumin 3.4 (*)    All other components within normal limits    EKG None  Radiology No results found.  Procedures Procedures    Medications Ordered in ED Medications - No data to display  ED Course/ Medical Decision Making/ A&P                           Medical Decision Making Amount and/or Complexity of Data Reviewed Labs: ordered.   This patient presents to the ED for concern of rash, this involves an extensive number of treatment options, and is a complaint that carries with it a high risk of complications and morbidity.  The differential diagnosis includes prickly heat, histamine reaction, parasite infestation, allergen exposure   Co morbidities that complicate the patient evaluation  N/A   Additional history obtained:  Additional history obtained from N/A External records from outside source obtained and reviewed  including EMR   Problem List / ED Course / Critical interventions / Medication management  Patient is a pleasant 61 year old female who presents for 4 days of diffuse pruritic rash.  She is well-appearing on arrival in the ED.  Vital signs notable for moderate hypertension.  She has had similar episodes in the past and states that they have been recurrent over the past 9 months.  Rash appears faintly maculopapular with some erythematous spots that are spread throughout her torso and extremities.  Findings are consistent with diffuse histamine reaction and/or prickly heat.  Given that patient's symptoms have been resolved in the past with prednisone.  Will prescribe prednisone taper.  Patient was advised to continue Benadryl as needed.  Given that she is otherwise well-appearing, with good underlying health, I do not feel the need for further diagnostic work-up.  Patient does have a primary doctor that she can see in follow-up.  Patient is stable for discharge at this time.         Final Clinical Impression(s) / ED Diagnoses Final diagnoses:  Pruritic dermatitis    Rx / DC Orders ED Discharge Orders          Ordered    predniSONE (STERAPRED UNI-PAK 21 TAB) 10 MG (21) TBPK tablet  Daily        06/03/22 0735              Gloris Manchester, MD 06/03/22 [phone]

## 2022-06-03 NOTE — ED Triage Notes (Signed)
Patient reports generalized itchy skin rashes/hives onset Wednesday this week , respirations unlabored /no fever.

## 2022-08-03 ENCOUNTER — Ambulatory Visit (INDEPENDENT_AMBULATORY_CARE_PROVIDER_SITE_OTHER): Payer: BC Managed Care – PPO | Admitting: Internal Medicine

## 2022-08-03 ENCOUNTER — Encounter: Payer: Self-pay | Admitting: Internal Medicine

## 2022-08-03 VITALS — BP 146/70 | HR 97 | Temp 97.6°F | Resp 16 | Ht 63.39 in | Wt 236.0 lb

## 2022-08-03 DIAGNOSIS — J310 Chronic rhinitis: Secondary | ICD-10-CM | POA: Diagnosis not present

## 2022-08-03 DIAGNOSIS — L501 Idiopathic urticaria: Secondary | ICD-10-CM | POA: Diagnosis not present

## 2022-08-03 DIAGNOSIS — T783XXA Angioneurotic edema, initial encounter: Secondary | ICD-10-CM | POA: Diagnosis not present

## 2022-08-03 DIAGNOSIS — H1013 Acute atopic conjunctivitis, bilateral: Secondary | ICD-10-CM | POA: Diagnosis not present

## 2022-08-03 MED ORDER — FEXOFENADINE HCL 180 MG PO TABS: 180.0000 mg | ORAL_TABLET | Freq: Two times a day (BID) | ORAL | 5 refills | Status: DC

## 2022-08-03 MED ORDER — FLUTICASONE PROPIONATE 50 MCG/ACT NA SUSP: 2.0000 | Freq: Every day | NASAL | 5 refills | Status: DC

## 2022-08-03 MED ORDER — FAMOTIDINE 20 MG PO TABS: 20.0000 mg | ORAL_TABLET | Freq: Two times a day (BID) | ORAL | 5 refills | Status: DC

## 2022-08-03 NOTE — Patient Instructions (Addendum)
Idiopathic Urticaria/Angioedema (Hives/Swelling): -Start Allegra (Fexofenadine) 180mg  twice daily and Pepcid (Famotidine) 20mg  twice daily. -If no improved in 2-3 days, increase to Allegra 360mg  twice daily and Pepcid 40mg  twice daily.  Chronic Rhinitis - Positive skin test 07/2022: none - Use nasal saline rinses before nose sprays such as with Neilmed Sinus Rinse.  Use distilled water.   - Use Flonase 2 sprays each nostril daily. Aim upward and outward.

## 2022-08-03 NOTE — Progress Notes (Signed)
NEW PATIENT  Date of Service/Encounter:  08/03/22  Consult requested by: Patient, No Pcp Per   Subjective:   Cindy Owens (DOB: 1961-02-02) is a 61 y.o. female who presents to the clinic on 08/03/2022 with a chief complaint of Angioedema (Had grits and eggs that morning and by lunch time lips started swelling. ), Allergy Testing, Establish Care, and Urticaria (Dealing with it all year, itching, skin looks like it is burnt looking. Hives all over body.) .    History obtained from: chart review and patient.  Hives/Swelling Reports first episode around January-Feb 2023.  Rash was itchy, raised and red like welts. She does recall having COVID in Dec 2022 with mild symptoms of headaches and weakness.  She also recalls having a bug bite and a blister but that has since resolved. No new foods/products.  No physical factors either.  She is currently breaking out almost daily.  She has not used any medications in the past 3 weeks for this appointment.  Previously was on PRN steroids and Loratadine.  Has received multiple courses of prednisone.  Whenever she stops those, the hives would come back.  Sometimes she is also having lip and face swelling with the hives; no throat swelling or trouble breathing.  No other symptoms with it.  ER visit 12/12/2020: hives; given benadryl and medrol dose pack Office visit 12/19/2020: hives; given triamcinolone/decadron injection, pepcid, claritin ER visit 06/03/2022: hives/rash; given prednisone dose pack Office visit 06/06/2022: hives and mouth/lip swelling; told to complete steroid pack and referred to [place] visit 07/03/2022: hives; triamcinolone injection/medrol pak ER visit 07/31/2022: hives and mouth/lip swelling; Epipen; not given any medications as she had an appointment with [place] today  Rhinitis: Ongoing for years Symptoms include:  congestion, rhinorrhea, sneezing, watery eyes, and itchy eyes  Occurs seasonally-Spring Potential triggers:  pollen Treatments tried: OTC anti histamines PRN Previous allergy testing: no History of reflux/heartburn: no History of chronic sinusitis or sinus surgery: no  Past Medical History: Past Medical History:  Diagnosis Date   Angio-edema    Urticaria    Past Surgical History: Past Surgical History:  Procedure Laterality Date   KNEE CARTILAGE SURGERY Right 08/2020   ORIF ANKLE FRACTURE BIMALLEOLAR      Family History: Family History  Problem Relation Age of Onset   Allergic rhinitis Sister     Social History:  Lives in a unknown year house Flooring in bedroom: Charity fundraiser Pets: none Tobacco use/exposure: none Job: utility work  Medication List:  Allergies as of 08/03/2022       Reactions   Codeine Nausea And Vomiting   Hydrocodone Nausea And Vomiting        Medication List        Accurate as of August 03, 2022  2:06 PM. If you have any questions, ask your nurse or doctor.          STOP taking these medications    loratadine 10 MG tablet Commonly known as: CLARITIN Stopped by: Larose Kells, MD       TAKE these medications    BC HEADACHE POWDER PO Take 1 packet by mouth daily as needed. For pain.   cephALEXin 500 MG capsule Commonly known as: KEFLEX Take 1 capsule (500 mg total) by mouth 3 (three) times daily.   cyclobenzaprine 10 MG tablet Commonly known as: FLEXERIL Take 10 mg by mouth.   EPINEPHrine 0.3 mg/0.3 mL Soaj injection Commonly known as: EPI-PEN Inject into the muscle.   famotidine  20 MG tablet Commonly known as: Pepcid Take 1 tablet (20 mg total) by mouth 2 (two) times daily. Started by: Birder Robson, MD   fexofenadine 180 MG tablet Commonly known as: Allegra Allergy Take 1 tablet (180 mg total) by mouth in the morning and at bedtime. What changed:  medication strength how much to take when to take this Changed by: Birder Robson, MD   fluticasone 50 MCG/ACT nasal spray Commonly known as: FLONASE Place 2 sprays into  both nostrils daily. Started by: Birder Robson, MD   ibuprofen 400 MG tablet Commonly known as: ADVIL Take 1 tablet (400 mg total) by mouth every 6 (six) hours as needed for pain.   predniSONE 10 MG (21) Tbpk tablet Commonly known as: STERAPRED UNI-PAK 21 TAB Take by mouth daily. Take 6 tabs by mouth daily  for 2 days, then 5 tabs for 2 days, then 4 tabs for 2 days, then 3 tabs for 2 days, 2 tabs for 2 days, then 1 tab by mouth daily for 2 days         REVIEW OF SYSTEMS: Pertinent positives and negatives discussed in HPI.   Objective:   Physical Exam: BP (!) 146/70   Pulse 97   Temp 97.6 F (36.4 C) (Temporal)   Resp 16   Ht 5' 3.39" (1.61 m)   Wt 236 lb (107 kg)   LMP 05/20/2012   SpO2 100%   BMI 41.30 kg/m  Body mass index is 41.3 kg/m. GEN: alert, well developed HEENT: clear conjunctiva, TM grey and translucent, nose with + inferior turbinate hypertrophy, pink nasal mucosa, slight clear rhinorrhea, no cobblestoning HEART: regular rate and rhythm, no murmur LUNGS: clear to auscultation bilaterally, no coughing, unlabored respiration ABDOMEN: soft, non distended  SKIN: no rashes or lesions  Reviewed:  Multiple ER and office visits reviewed in HPI   Skin Testing:  Skin prick testing was placed, which includes aeroallergens/foods, histamine control, and saline control.  Verbal consent was obtained prior to placing test.  We discussed risks including anaphylaxis. Patient tolerated procedure well.  Allergy testing results were read and interpreted by myself, documented by clinical staff. Adequate positive and negative control.  Results discussed with patient/family.  Airborne Adult Perc - 08/03/22 0939     Time Antigen Placed 0919    Allergen Manufacturer Waynette Buttery    Location Back    Number of Test 59    1. Control-Buffer 50% Glycerol Negative    2. Control-Histamine 1 mg/ml 3+    3. Albumin saline Negative    4. Bahia Negative    5. French Southern Territories Negative    6.  Johnson Negative    7. Kentucky Blue Negative    8. Meadow Fescue Negative    9. Perennial Rye Negative    10. Sweet Vernal Negative    11. Timothy Negative    12. Cocklebur Negative    13. Burweed Marshelder Negative    14. Ragweed, short Negative    15. Ragweed, Giant Negative    16. Plantain,  English Negative    17. Lamb's Quarters Negative    18. Sheep Sorrell Negative    19. Rough Pigweed Negative    20. Marsh Elder, Rough Negative    21. Mugwort, Common Negative    22. Ash mix Negative    23. Birch mix Negative    24. Beech American Negative    25. Box, Elder Negative    26. Cedar, red Negative  27. Cottonwood, Eastern Negative    28. Elm mix Negative    29. Hickory Negative    30. Maple mix Negative    31. Oak, Guinea-Bissau mix Negative    32. Pecan Pollen Negative    33. Pine mix Negative    34. Sycamore Eastern Negative    35. Walnut, Black Pollen Negative    36. Alternaria alternata Negative    37. Cladosporium Herbarum Negative    38. Aspergillus mix Negative    39. Penicillium mix Negative    40. Bipolaris sorokiniana (Helminthosporium) Negative    41. Drechslera spicifera (Curvularia) Negative    42. Mucor plumbeus Negative    43. Fusarium moniliforme Negative    44. Aureobasidium pullulans (pullulara) Negative    45. Rhizopus oryzae Negative    46. Botrytis cinera Negative    47. Epicoccum nigrum Negative    48. Phoma betae Negative    49. Candida Albicans Negative    50. Trichophyton mentagrophytes Negative    51. Mite, D Farinae  5,000 AU/ml Negative    52. Mite, D Pteronyssinus  5,000 AU/ml Negative    53. Cat Hair 10,000 BAU/ml Negative    54.  Dog Epithelia Negative    55. Mixed Feathers Negative    56. Horse Epithelia Negative    57. Cockroach, German Negative    58. Mouse Negative    59. Tobacco Leaf Negative             Intradermal - 08/03/22 1357     Time Antigen Placed 8185    Allergen Manufacturer Waynette Buttery    Location Arm    Number  of Test 15    Control Negative    French Southern Territories Negative    Johnson Negative    7 Grass Negative    Ragweed mix Negative    Weed mix Negative    Tree mix Negative    Mold 1 Negative    Mold 2 Negative    Mold 3 Negative    Mold 4 Negative    Cat Negative    Dog Negative    Cockroach Negative    Mite mix Negative               Assessment:   1. Idiopathic urticaria   2. Allergic conjunctivitis of both eyes   3. Chronic rhinitis     Plan/Recommendations:  Idiopathic Urticaria/Angioedema (Hives/Swelling): -Start Allegra (Fexofenadine) 180mg  twice daily and Pepcid (Famotidine) 20mg  twice daily. -If no improved in 2-3 days, increase to Allegra 360mg  twice daily and Pepcid 40mg  twice daily.  Chronic Rhinitis Allergic Conjunctivitis - Positive skin test 07/2022: none - Use nasal saline rinses before nose sprays such as with Neilmed Sinus Rinse.  Use distilled water.   - Use Flonase 2 sprays each nostril daily. Aim upward and outward.    Return in about 4 weeks (around 08/31/2022).  , MD [facility]

## 2022-08-31 ENCOUNTER — Other Ambulatory Visit: Payer: Self-pay | Admitting: Internal Medicine

## 2022-08-31 ENCOUNTER — Ambulatory Visit (INDEPENDENT_AMBULATORY_CARE_PROVIDER_SITE_OTHER): Payer: BC Managed Care – PPO | Admitting: Internal Medicine

## 2022-08-31 ENCOUNTER — Encounter: Payer: Self-pay | Admitting: Internal Medicine

## 2022-08-31 VITALS — BP 122/72 | HR 80 | Temp 97.8°F | Resp 18

## 2022-08-31 DIAGNOSIS — J310 Chronic rhinitis: Secondary | ICD-10-CM

## 2022-08-31 DIAGNOSIS — L501 Idiopathic urticaria: Secondary | ICD-10-CM | POA: Diagnosis not present

## 2022-08-31 DIAGNOSIS — T783XXD Angioneurotic edema, subsequent encounter: Secondary | ICD-10-CM

## 2022-08-31 DIAGNOSIS — H1013 Acute atopic conjunctivitis, bilateral: Secondary | ICD-10-CM

## 2022-08-31 MED ORDER — MONTELUKAST SODIUM 10 MG PO TABS: 10.0000 mg | ORAL_TABLET | Freq: Every day | ORAL | 5 refills | Status: DC

## 2022-08-31 MED ORDER — FEXOFENADINE HCL 180 MG PO TABS: 360.0000 mg | ORAL_TABLET | Freq: Two times a day (BID) | ORAL | 5 refills | Status: DC

## 2022-08-31 MED ORDER — FAMOTIDINE 40 MG PO TABS: 40.0000 mg | ORAL_TABLET | Freq: Two times a day (BID) | ORAL | 5 refills | Status: DC

## 2022-08-31 MED ORDER — FLUTICASONE PROPIONATE 50 MCG/ACT NA SUSP: 2.0000 | Freq: Every day | NASAL | 5 refills | Status: AC

## 2022-08-31 NOTE — Patient Instructions (Addendum)
Idiopathic Urticaria/Angioedema (Hives/Swelling): - Restart Allegra 360mg  twice daily and Pepcid 40mg  twice daily.  - Start Singulair 10mg  daily.  Hold if you have any behavioral/mood changes.   - We will work on with your insurance company for Xolair 300mg  every 4 weeks.  You will need to bring your Epipen with you for every shot visit.    Chronic Rhinitis - Positive skin test 07/2022: none - Use nasal saline rinses before nose sprays such as with Neilmed Sinus Rinse.  Use distilled water.   - Use Flonase 2 sprays each nostril daily. Aim upward and outward.

## 2022-08-31 NOTE — Progress Notes (Signed)
   FOLLOW UP Date of Service/Encounter:  08/31/22   Subjective:  Cindy Owens (DOB: [birth year]) is a 61 y.o. female who [facility] for follow up for idiopathic urticaria/angioedema and chronic rhinitis.  History obtained from: chart review and patient. Last visit 08/03/2022 with me.  Seen for CSU/Angioedema and chronic rhinitis.  Started on Allegra/Pepcid/Flonase.    Since last visit, she reports taking the Allegra 360mg  BID and Pepcid 40mg  BID without much improvement. She was also worried maybe it was making it worse so she stopped it.  She still continues to have hives daily and also has been having swelling intermittently especially of her hands/feet/lips.    In terms of her rhinoconjunctivitis she reports doing well.  She has not had much congestion, runny nose or ocular symptoms. She is using Flonase PRN which helps control her symptoms.   She does not do the nasal rinses.    Past Medical History: Past Medical History:  Diagnosis Date   Angio-edema    Urticaria     Objective:  BP 122/72   Pulse 80   Temp 97.8 F (36.6 C) (Temporal)   Resp 18   LMP 05/20/2012   SpO2 98%  There is no height or weight on file to calculate BMI. Physical Exam: GEN: alert, well developed HEENT: clear conjunctiva, TM grey and translucent, nose with moderate inferior turbinate hypertrophy, pink nasal mucosa, no rhinorrhea, + cobblestoning HEART: regular rate and rhythm, no murmur LUNGS: clear to auscultation bilaterally, no coughing, unlabored respiration SKIN: no rashes or lesions   Assessment/Plan  Idiopathic Urticaria/Angioedema (Hives/Swelling): - Restart Allegra 360mg  twice daily and Pepcid 40mg  twice daily.  - Start Singulair 10mg  daily.  Hold if you have any behavioral/mood changes.   - Will obtain CSU labwork prior to starting Xolair.  - She remains uncontrolled on maximal antihistamines.  We will work on with your  insurance company for Xolair 300mg  every 4 weeks.  You will need to bring your Epipen with you for every shot visit.    Chronic Rhinitis - Positive skin test 07/2022: none - Use nasal saline rinses before nose sprays such as with Neilmed Sinus Rinse.  Use distilled water.   - Use Flonase 2 sprays each nostril daily. Aim upward and outward.    Return in about 2 months (around 10/31/2022). , MD  [facility]

## 2022-09-07 LAB — CMP14+EGFR
ALT: 10 IU/L (ref 0–32)
AST: 16 IU/L (ref 0–40)
Albumin/Globulin Ratio: 1.5 (ref 1.2–2.2)
Albumin: 4.1 g/dL (ref 3.8–4.9)
Alkaline Phosphatase: 93 IU/L (ref 44–121)
BUN/Creatinine Ratio: 12 (ref 12–28)
BUN: 9 mg/dL (ref 8–27)
Bilirubin Total: 0.6 mg/dL (ref 0.0–1.2)
CO2: 22 mmol/L (ref 20–29)
Calcium: 9.2 mg/dL (ref 8.7–10.3)
Chloride: 107 mmol/L — ABNORMAL HIGH (ref 96–106)
Creatinine, Ser: 0.77 mg/dL (ref 0.57–1.00)
Globulin, Total: 2.7 g/dL (ref 1.5–4.5)
Glucose: 83 mg/dL (ref 70–99)
Potassium: 4.2 mmol/L (ref 3.5–5.2)
Sodium: 143 mmol/L (ref 134–144)
Total Protein: 6.8 g/dL (ref 6.0–8.5)
eGFR: 88 mL/min/{1.73_m2} (ref >59)

## 2022-09-07 LAB — CBC WITH DIFFERENTIAL/PLATELET
Basophils Absolute: 0 10*3/uL (ref 0.0–0.2)
Basos: 0 %
EOS (ABSOLUTE): 0 10*3/uL (ref 0.0–0.4)
Eos: 0 %
Hematocrit: 33.5 % — ABNORMAL LOW (ref 34.0–46.6)
Hemoglobin: 10.4 g/dL — ABNORMAL LOW (ref 11.1–15.9)
Immature Grans (Abs): 0 10*3/uL (ref 0.0–0.1)
Immature Granulocytes: 0 %
Lymphocytes Absolute: 0.8 10*3/uL (ref 0.7–3.1)
Lymphs: 15 %
MCH: 24 pg — ABNORMAL LOW (ref 26.6–33.0)
MCHC: 31 g/dL — ABNORMAL LOW (ref 31.5–35.7)
MCV: 77 fL — ABNORMAL LOW (ref 79–97)
Monocytes Absolute: 0.4 10*3/uL (ref 0.1–0.9)
Monocytes: 7 %
Neutrophils Absolute: 4 10*3/uL (ref 1.4–7.0)
Neutrophils: 78 %
Platelets: 296 10*3/uL (ref 150–450)
RBC: 4.33 x10E6/uL (ref 3.77–5.28)
RDW: 16.4 % — ABNORMAL HIGH (ref 11.7–15.4)
WBC: 5.2 10*3/uL (ref 3.4–10.8)

## 2022-09-07 LAB — SEDIMENTATION RATE: Sed Rate: 7 mm/hr (ref 0–40)

## 2022-09-07 LAB — TSH+FREE T4
Free T4: 1.13 ng/dL (ref 0.82–1.77)
TSH: 1.74 u[IU]/mL (ref 0.450–4.500)

## 2022-09-07 LAB — CHRONIC URTICARIA: cu index: >50 — ABNORMAL HIGH (ref <10)

## 2022-09-07 LAB — C3 AND C4
Complement C3, Serum: 135 mg/dL (ref 82–167)
Complement C4, Serum: 40 mg/dL — ABNORMAL HIGH (ref 12–38)

## 2022-09-07 LAB — C-REACTIVE PROTEIN: CRP: 10 mg/L (ref 0–10)

## 2022-09-07 LAB — ANA W/REFLEX: Anti Nuclear Antibody (ANA): NEGATIVE

## 2022-09-07 LAB — TRYPTASE: Tryptase: 11.7 ug/L (ref 2.2–13.2)

## 2022-09-21 ENCOUNTER — Telehealth: Payer: Self-pay | Admitting: *Deleted

## 2022-09-21 NOTE — Telephone Encounter (Signed)
-----   Message from Birder Robson, MD sent at 09/14/2022  9:14 AM EST ----- Regarding: Cindy Owens  I just want to make sure I messaged you about this patient.  She has uncontrolled hives and we would like to start Xolair.     Thanks!

## 2022-09-21 NOTE — Telephone Encounter (Signed)
L/m for patient to contact me to advise approval, copay card and submit to [facility] for Xolair

## 2022-10-23 NOTE — Telephone Encounter (Signed)
Tried to contact patient no answer 

## 2022-11-02 ENCOUNTER — Ambulatory Visit: Payer: BC Managed Care – PPO | Admitting: Internal Medicine

## 2022-11-05 NOTE — Telephone Encounter (Signed)
Called patient and advised that she wants to hold off on Xolair for now due to side effects and unknown cause of her hives. I did explain she may never know cause and rare side effects with patient. I advised if she changes her mind feel free to reach out to me or MD

## 2022-11-20 ENCOUNTER — Ambulatory Visit: Payer: BC Managed Care – PPO | Admitting: Internal Medicine

## 2022-11-20 DIAGNOSIS — J309 Allergic rhinitis, unspecified: Secondary | ICD-10-CM

## 2022-11-26 ENCOUNTER — Telehealth: Payer: Self-pay | Admitting: *Deleted

## 2022-11-26 NOTE — Telephone Encounter (Signed)
Patient called and advised anaphylactic reaction and in hospital now. She has decided she wants to start Xolair due to this reaction. I advised will submit to [facility] and reach out once delivery set to make appt to start therapy. FYI

## 2022-12-11 ENCOUNTER — Other Ambulatory Visit: Payer: Self-pay

## 2022-12-11 ENCOUNTER — Encounter: Payer: Self-pay | Admitting: Internal Medicine

## 2022-12-11 ENCOUNTER — Ambulatory Visit (INDEPENDENT_AMBULATORY_CARE_PROVIDER_SITE_OTHER): Payer: BC Managed Care – PPO | Admitting: Internal Medicine

## 2022-12-11 VITALS — BP 138/90 | HR 76 | Temp 99.1°F | Resp 16 | Wt 237.1 lb

## 2022-12-11 DIAGNOSIS — J310 Chronic rhinitis: Secondary | ICD-10-CM | POA: Diagnosis not present

## 2022-12-11 DIAGNOSIS — L501 Idiopathic urticaria: Secondary | ICD-10-CM | POA: Diagnosis not present

## 2022-12-11 MED ORDER — CETIRIZINE HCL 10 MG PO TABS: 10.0000 mg | ORAL_TABLET | Freq: Two times a day (BID) | ORAL | 5 refills | Status: AC

## 2022-12-11 MED ORDER — OMALIZUMAB 150 MG/ML ~~LOC~~ SOSY
300.0000 mg | PREFILLED_SYRINGE | SUBCUTANEOUS | Status: AC
  Administered 2022-12-11 – 2023-04-02 (×5): 300 mg via SUBCUTANEOUS

## 2022-12-11 MED ORDER — MONTELUKAST SODIUM 10 MG PO TABS: 10.0000 mg | ORAL_TABLET | Freq: Every day | ORAL | 5 refills | Status: AC

## 2022-12-11 MED ORDER — FAMOTIDINE 20 MG PO TABS: 20.0000 mg | ORAL_TABLET | Freq: Two times a day (BID) | ORAL | 5 refills | Status: AC

## 2022-12-11 MED ORDER — EPINEPHRINE 0.3 MG/0.3ML IJ SOAJ: 0.3000 mg | INTRAMUSCULAR | 1 refills | Status: AC | PRN

## 2022-12-11 NOTE — Patient Instructions (Addendum)
Idiopathic Urticaria/Angioedema (Hives/Swelling): - Continue Zyrtec (Cetirizine) '10mg'$  twice daily. - Continue Pepcid (Famotidine) '20mg'$  twice daily.  - Continue Singulair (Montelukast) '10mg'$  daily.   - Continue Xolair '300mg'$  every 4 weeks.  Keep Epipen with you.  First dose given today in clinic.  Chronic Rhinitis - Positive skin test 07/2022: none - Use nasal saline rinses before nose sprays such as with Neilmed Sinus Rinse.  Use distilled water.   - Use Flonase 2 sprays each nostril daily. Aim upward and outward.  Cindy Owens  Return in about 2 months (around 02/09/2023).

## 2022-12-11 NOTE — Progress Notes (Signed)
   FOLLOW UP Date of Service/Encounter:  12/11/22   Subjective:  Cindy Owens (DOB: 12-31-1960) is a 62 y.o. female who [facility] to [facility] on 12/11/2022 for follow up for idiopathic urticaria/angioedema and chronic rhinitis.   History obtained from: chart review and patient. Last visit was with me 08/31/2022 where we discussed Xolair for CSU but she was hesitant on starting it.  Since then, she reports being hospitalized for a reaction.  Recalls eating oatmeal and sausage for breakfast and then chips and hot cocoa later.  Afterwards, noted to have facial swelling, shortness of breath, tongue swelling and voice changes.  She went to the ER and was given Epi, Solumedrol, Albuterol, Pepcid and Racemic Epi.  Was given Epi infusion also.  Had improvement and was admitted to ICU for close observation.  Discharged a day later with prednisone taper, Zyrtec/Pepcid and Singulair. After this incident, she was scared and wants to start Xolair so she messaged Tammy and has gotten it ordered for first dose today.   Of note, she was having hives prior to this incident and having itching but reports the baking soda bath was helping.    Since the hospitalization, denies any episodes of hives, itching or angioedema.   Taking Singulair 81m daily, Zyrtec 158mdaily and Pepcid 202mID  In terms of her rhinitis, reports doing okay overall.  She just has some congestion and drainage in the morning but not too bad.  She does not use the Flonase or saline rinses.  She does not like taking medications.   Past Medical History: Past Medical History:  Diagnosis Date   Angio-edema    Urticaria     Objective:  BP (!) 138/90   Pulse 76   Temp 99.1 F (37.3 C) (Temporal)   Resp 16   Wt 237 lb 1.6 oz (107.5 kg)   LMP 05/20/2012   SpO2 97%   BMI 41.49 kg/m  Body mass index is 41.49 kg/m. Physical Exam: GEN: alert, well developed HEENT: clear conjunctiva, nose with mild inferior turbinate  hypertrophy, pink nasal mucosa, no rhinorrhea, no cobblestoning HEART: regular rate and rhythm, no murmur LUNGS: clear to auscultation bilaterally, no coughing, unlabored respiration SKIN: no rashes or lesions  Assessment:   1. Idiopathic urticaria   2. Chronic rhinitis     Plan/Recommendations:   Idiopathic Urticaria/Angioedema (Hives/Swelling): - Remains uncontrolled with severe episode requiring hospitalization so starting Xolair today.  - Previous labs with elevated CU index. Tryptase was normal but 11.7, can consider workup for HAT if symptoms worsen despite Xolair.  - Continue Zyrtec (Cetirizine) 82m31mice daily. - Continue Pepcid (Famotidine) 20mg38mce daily.  - Continue Singulair (Montelukast) 82mg 3my.   - Continue Xolair 300mg e23m 4 weeks.  Keep Epipen with you.  First dose given today in clinic.  Chronic Rhinitis - Positive skin test 07/2022: none - Use nasal saline rinses before nose sprays such as with Neilmed Sinus Rinse.  Use distilled water.   - Use Flonase 2 sprays each nostril daily. Aim upward and outward.   Return in about 2 months (around 02/09/2023).  Alexxus Sobh [facility]

## 2022-12-11 NOTE — Progress Notes (Signed)
Immunotherapy   Patient Details  Name: Cindy Owens MRN: [id number] Date of Birth: 1961/04/26  12/11/2022  Cindy Owens started injections for  Hives. Patient received 300 mg of Xolair and waited 30 minutes with no problems.  Frequency: every 28 days Epi-Pen:Epi-Pen Available  Consent signed and patient instructions given.   Herbie Drape 12/11/2022, 4:42 PM

## 2023-01-08 ENCOUNTER — Ambulatory Visit (INDEPENDENT_AMBULATORY_CARE_PROVIDER_SITE_OTHER): Payer: BC Managed Care – PPO

## 2023-01-08 DIAGNOSIS — L501 Idiopathic urticaria: Secondary | ICD-10-CM

## 2023-02-05 ENCOUNTER — Ambulatory Visit (INDEPENDENT_AMBULATORY_CARE_PROVIDER_SITE_OTHER): Payer: BC Managed Care – PPO

## 2023-02-05 DIAGNOSIS — L501 Idiopathic urticaria: Secondary | ICD-10-CM | POA: Diagnosis not present

## 2023-03-05 ENCOUNTER — Ambulatory Visit (INDEPENDENT_AMBULATORY_CARE_PROVIDER_SITE_OTHER): Payer: BC Managed Care – PPO

## 2023-03-05 DIAGNOSIS — L501 Idiopathic urticaria: Secondary | ICD-10-CM

## 2023-04-02 ENCOUNTER — Ambulatory Visit (INDEPENDENT_AMBULATORY_CARE_PROVIDER_SITE_OTHER): Payer: BC Managed Care – PPO | Admitting: *Deleted

## 2023-04-02 DIAGNOSIS — L501 Idiopathic urticaria: Secondary | ICD-10-CM

## 2023-04-22 ENCOUNTER — Other Ambulatory Visit: Payer: Self-pay | Admitting: *Deleted

## 2023-04-22 MED ORDER — XOLAIR 150 MG/ML ~~LOC~~ SOSY: 300.0000 mg | PREFILLED_SYRINGE | SUBCUTANEOUS | 11 refills | Status: AC

## 2023-04-26 ENCOUNTER — Telehealth: Payer: Self-pay | Admitting: Internal Medicine

## 2023-04-26 NOTE — Telephone Encounter (Signed)
Called patient to schedule Xolair reapproval appointment.

## 2023-04-30 ENCOUNTER — Ambulatory Visit: Payer: BC Managed Care – PPO

## 2023-05-16 ENCOUNTER — Other Ambulatory Visit (HOSPITAL_COMMUNITY): Payer: Self-pay
